# Patient Record
Sex: Female | Born: 1975 | Race: Asian | Hispanic: No | Marital: Married | Smoking: Never smoker
Health system: Southern US, Community
[De-identification: ages and names within clinical notes are randomized; demographics above are authoritative.]

## PROBLEM LIST (undated history)

## (undated) DIAGNOSIS — Z789 Other specified health status: Secondary | ICD-10-CM

## (undated) HISTORY — PX: NO PAST SURGERIES: SHX2092

---

## 2004-05-05 ENCOUNTER — Ambulatory Visit (HOSPITAL_COMMUNITY): Admission: RE | Admit: 2004-05-05 | Discharge: 2004-05-05 | Payer: Self-pay | Admitting: Gynecology

## 2004-09-15 ENCOUNTER — Inpatient Hospital Stay: Payer: Self-pay | Admitting: Obstetrics and Gynecology

## 2005-07-07 IMAGING — US US OB COMP +14 WK
1 series · 13 of 28 positions shown · non-contrast
Comparison: none

CLINICAL DATA: 20 week 0 day gestational age by LMP.  Evaluate dating and anatomy.

[Series 1: us ob comp +14 wk · 0.29mm/px · 13 of 69 slices shown]
[im 3/69]
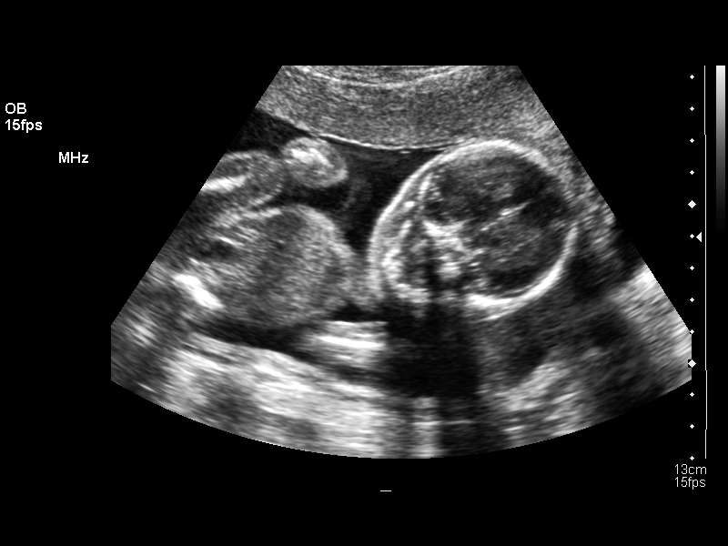
[im 8/69]
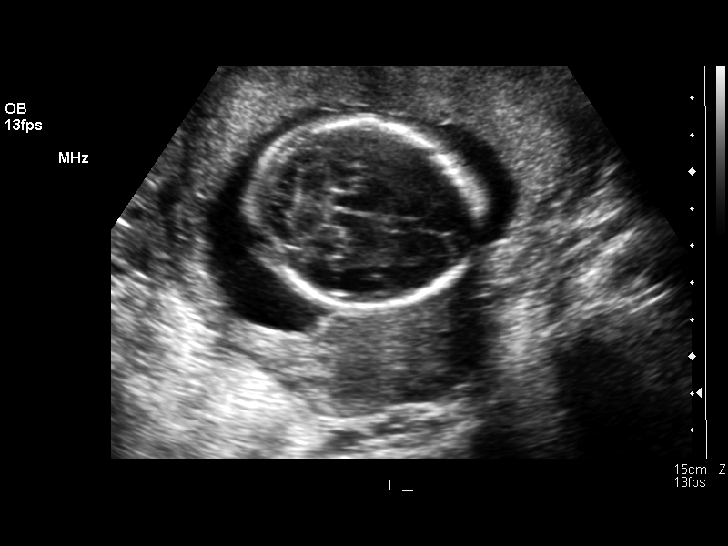
[im 13/69]
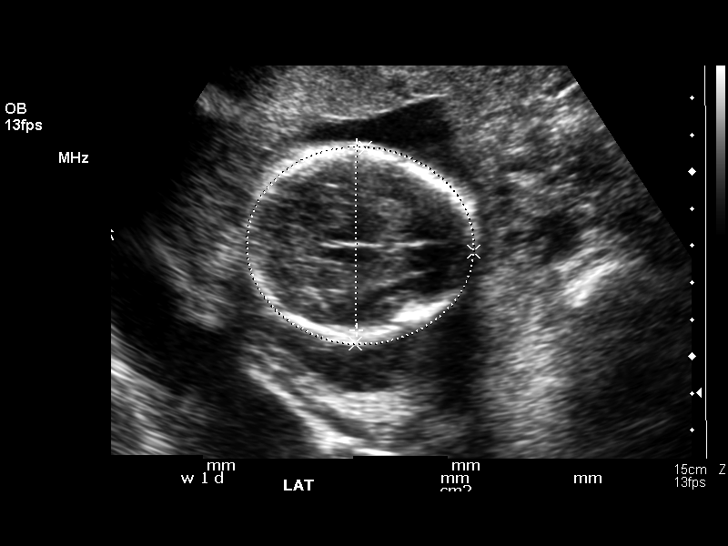
[im 18/69]
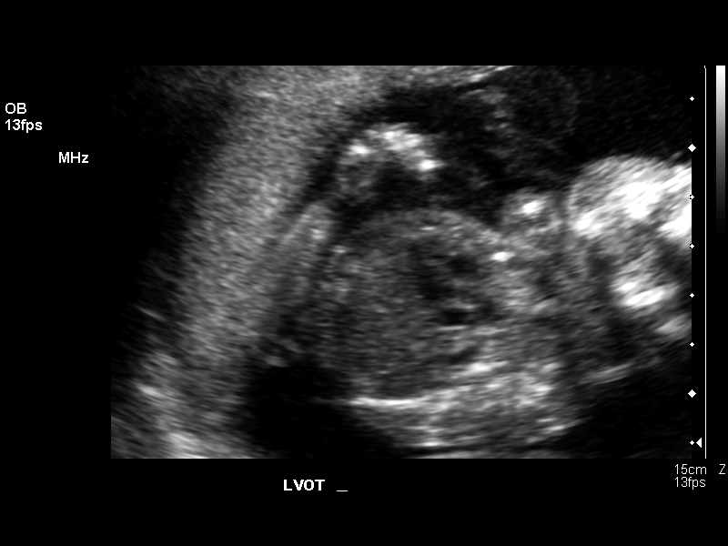
[im 23/69]
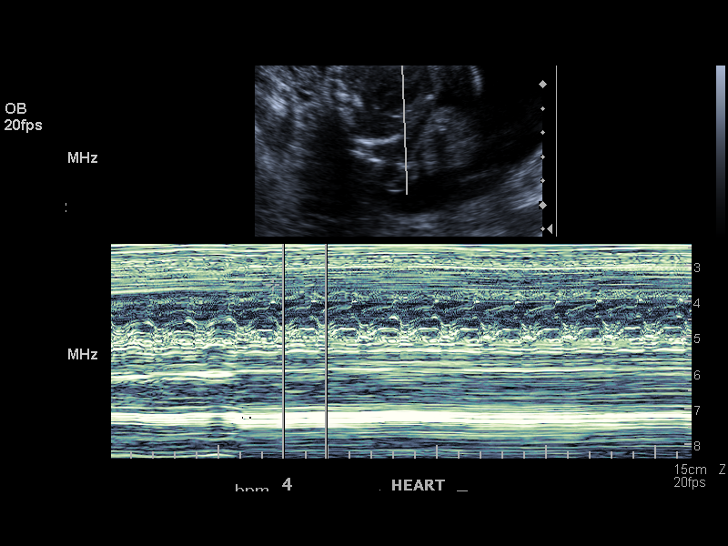
[im 28/69]
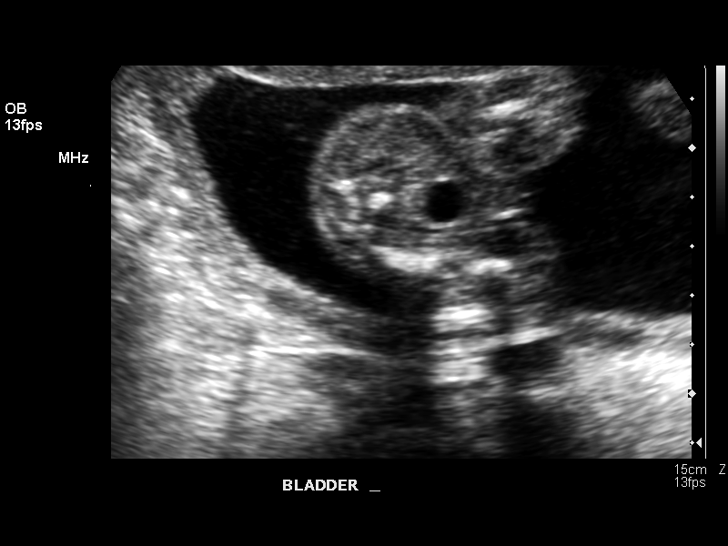
[im 36/69]
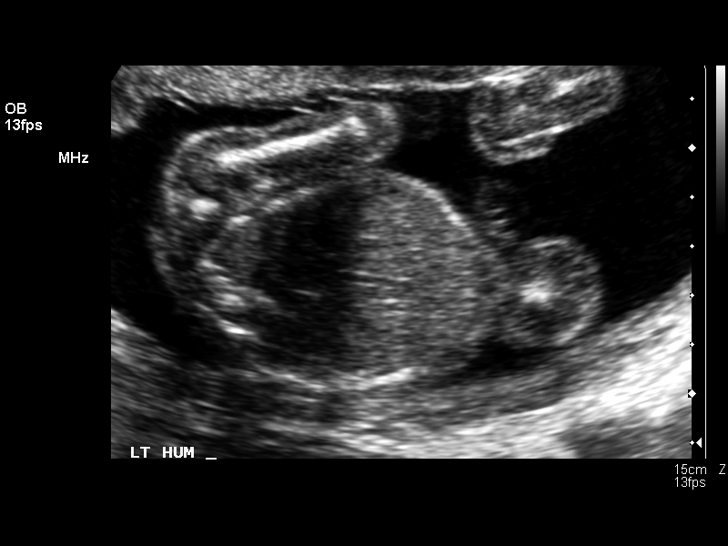
[im 41/69]
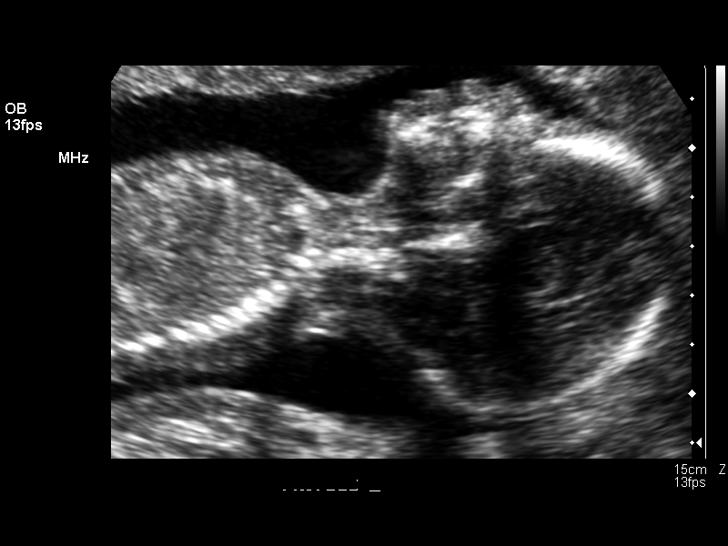
[im 46/69]
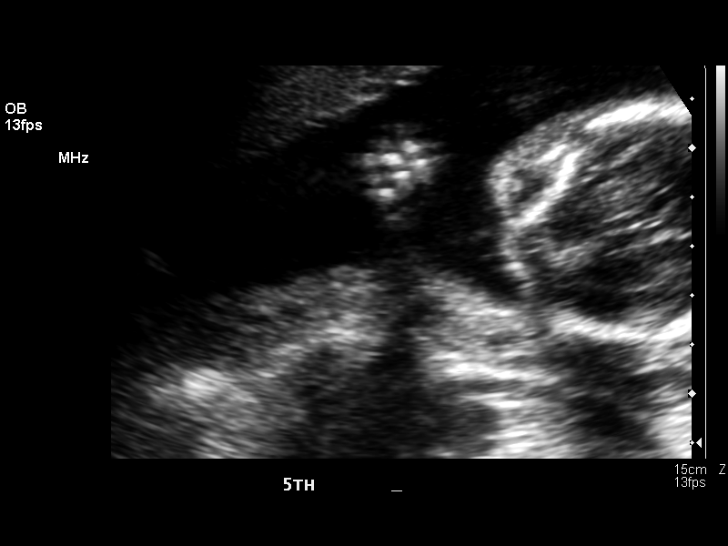
[im 51/69]
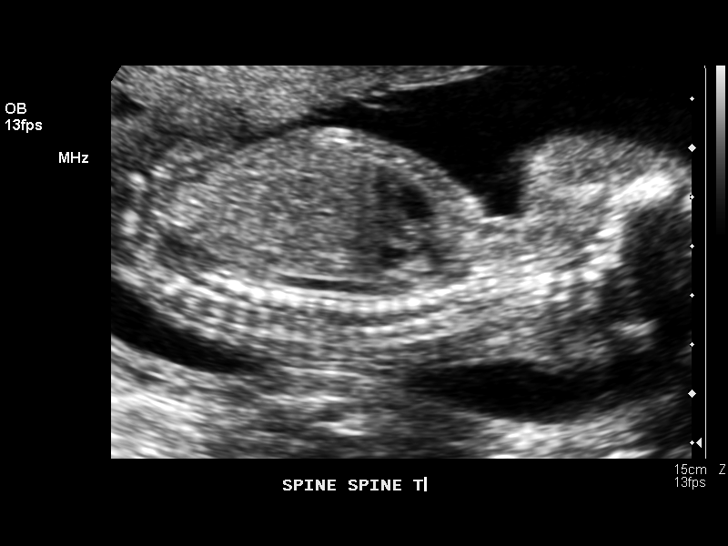
[im 56/69]
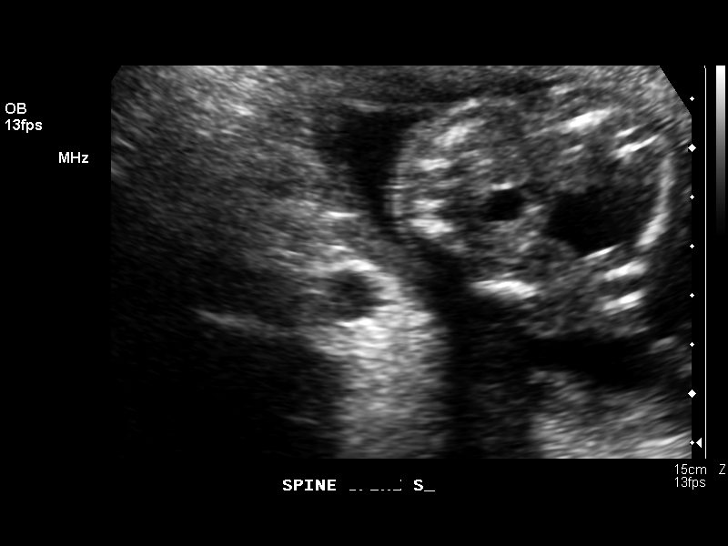
[im 61/69]
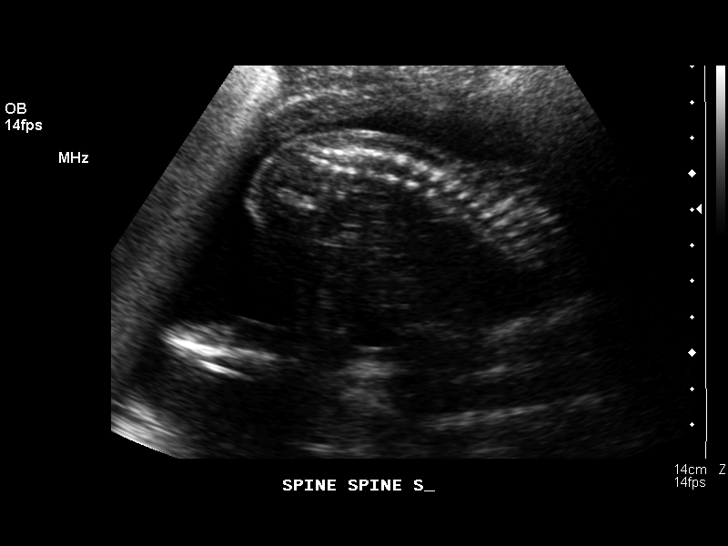
[im 66/69]
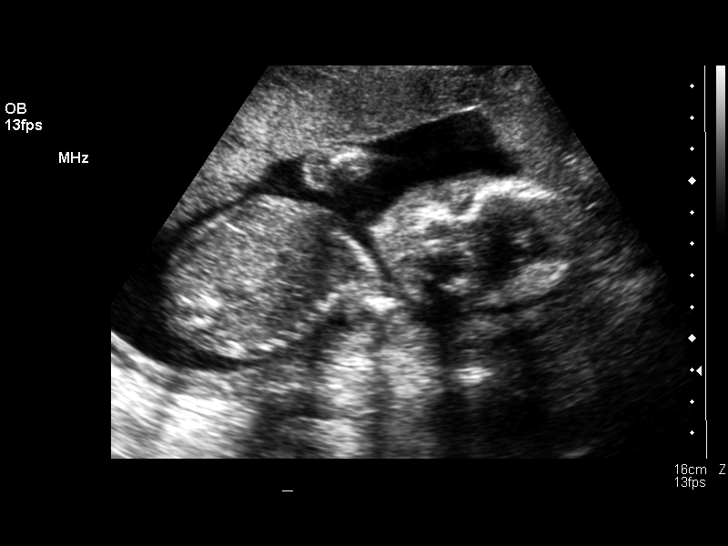

[13 of 28 positions shown; findings below may reference images not displayed]

OBSTETRICAL ULTRASOUND
 Number of Fetuses:  1
 Heart Rate:  153
 Movement:  Yes
 Breathing:    No  
 Presentation:  Cephalic
 Placental Location:  Anterior
 Grade:  I
 Previa:  No
 Amniotic Fluid (Subjective):  Normal
 Amniotic Fluid (Objective):   4.7 cm Vertical pocket 

 FETAL BIOMETRY
 BPD:   5.0 cm   21 w 1 d
 HC:   18.1 cm  20 w 4 d
 AC:   15.6 cm   20 w 5 d
 FL:    3.2 cm   19 w 6 d

 MEAN GA:  20 w 4 d

 FETAL ANATOMY
 Lateral Ventricles:    Visualized 
 Thalami/CSP:      Visualized 
 Posterior Fossa:  Visualized 
 Nuchal Region:    Visualized 
 Spine:      Visualized 
 4 Chamber Heart on Left:      Visualized 
 Stomach on Left:      Visualized 
 3 Vessel Cord:    Visualized 
 Cord Insertion site:    Visualized 
 Kidneys:  Visualized 
 Bladder:  Visualized 
 Extremities:      Visualized 

 ADDITIONAL ANATOMY VISUALIZED:  LVOT, RVOT, upper lip, orbits, profile, diaphragm, 5th digit, ductal arch, aortic arch, and male genitalia.

 MATERNAL FINDINGS
 Cervix:   3.3 cm Transabdominally
IMPRESSION: Single living intrauterine fetus with mean gestational age of 20 weeks 4 days and sonographic EDC of 09/18/04.  This is within one week of stated LMP.
 No evidence of fetal anatomic abnormality.

 </u12:p>

## 2006-12-08 ENCOUNTER — Inpatient Hospital Stay: Payer: Self-pay | Admitting: Obstetrics and Gynecology

## 2015-08-14 ENCOUNTER — Emergency Department
Admission: EM | Admit: 2015-08-14 | Discharge: 2015-08-14 | Disposition: A | Discharge status: Home / Self Care | Payer: BLUE CROSS/BLUE SHIELD | Attending: Emergency Medicine | Admitting: Emergency Medicine

## 2015-08-14 ENCOUNTER — Encounter: Payer: Self-pay | Admitting: *Deleted

## 2015-08-14 ENCOUNTER — Emergency Department: Payer: BLUE CROSS/BLUE SHIELD

## 2015-08-14 DIAGNOSIS — Y92410 Unspecified street and highway as the place of occurrence of the external cause: Secondary | ICD-10-CM | POA: Diagnosis not present

## 2015-08-14 DIAGNOSIS — S29001A Unspecified injury of muscle and tendon of front wall of thorax, initial encounter: Secondary | ICD-10-CM | POA: Insufficient documentation

## 2015-08-14 DIAGNOSIS — Y9389 Activity, other specified: Secondary | ICD-10-CM | POA: Insufficient documentation

## 2015-08-14 DIAGNOSIS — R030 Elevated blood-pressure reading, without diagnosis of hypertension: Secondary | ICD-10-CM | POA: Diagnosis not present

## 2015-08-14 DIAGNOSIS — Y998 Other external cause status: Secondary | ICD-10-CM | POA: Diagnosis not present

## 2015-08-14 DIAGNOSIS — S299XXA Unspecified injury of thorax, initial encounter: Secondary | ICD-10-CM | POA: Diagnosis present

## 2015-08-14 DIAGNOSIS — R0789 Other chest pain: Secondary | ICD-10-CM

## 2015-08-14 MED ORDER — TRAMADOL HCL 50 MG PO TABS: 50.0000 mg | ORAL_TABLET | Freq: Four times a day (QID) | ORAL | Status: AC | PRN

## 2015-08-14 MED ORDER — IBUPROFEN 800 MG PO TABS
800.0000 mg | ORAL_TABLET | Freq: Once | ORAL | Status: AC
  Administered 2015-08-14: 800 mg via ORAL
  Filled 2015-08-14: qty 1

## 2015-08-14 MED ORDER — TRAMADOL HCL 50 MG PO TABS
50.0000 mg | ORAL_TABLET | Freq: Once | ORAL | Status: AC
  Administered 2015-08-14: 50 mg via ORAL
  Filled 2015-08-14: qty 1

## 2015-08-14 MED ORDER — IBUPROFEN 800 MG PO TABS: 800.0000 mg | ORAL_TABLET | Freq: Three times a day (TID) | ORAL | Status: AC | PRN
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
## 2015-08-14 NOTE — Discharge Instructions (Signed)
Chest Wall Pain °Chest wall pain is pain in or around the bones and muscles of your chest. Sometimes, an injury causes this pain. Sometimes, the cause may not be known. This pain may take several weeks or longer to get better. °HOME CARE °Pay attention to any changes in your symptoms. Take these actions to help with your pain: °· Rest as told by your doctor. °· Avoid activities that cause pain. Try not to use your chest, belly (abdominal), or side muscles to lift heavy things. °· If directed, apply ice to the painful area: °¨ Put ice in a plastic bag. °¨ Place a towel between your skin and the bag. °¨ Leave the ice on for 20 minutes, 2-3 times per day. °· Take over-the-counter and prescription medicines only as told by your doctor. °· Do not use tobacco products, including cigarettes, chewing tobacco, and e-cigarettes. If you need help quitting, ask your doctor. °· Keep all follow-up visits as told by your doctor. This is important. °GET HELP IF: °· You have a fever. °· Your chest pain gets worse. °· You have new symptoms. °GET HELP RIGHT AWAY IF: °· You feel sick to your stomach (nauseous) or you throw up (vomit). °· You feel sweaty or light-headed. °· You have a cough with phlegm (sputum) or you cough up blood. °· You are short of breath. °  °This information is not intended to replace advice given to you by your health care provider. Make sure you discuss any questions you have with your health care provider. °  °Document Released: 12/20/2007 Document Revised: 03/24/2015 Document Reviewed: 09/28/2014 °Elsevier Interactive Patient Education ©2016 [employer] ° °
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
## 2019-12-04 ENCOUNTER — Ambulatory Visit: Payer: Self-pay | Attending: Internal Medicine

## 2019-12-04 DIAGNOSIS — Z23 Encounter for immunization: Secondary | ICD-10-CM

## 2019-12-04 NOTE — Progress Notes (Signed)
   Covid-19 Vaccination Clinic  Name:  Yesenia Liu    MRN: [id number] DOB: 08-Dec-1975  12/04/2019  Yesenia Liu was observed post Covid-19 immunization for 15 minutes without incident. She was provided with Vaccine Information Sheet and instruction to access the V-Safe system.   Yesenia Liu was instructed to call 911 with any severe reactions post vaccine: Marland Kitchen Difficulty breathing  . Swelling of face and throat  . A fast heartbeat  . A bad rash all over body  . Dizziness and weakness

## 2019-12-25 ENCOUNTER — Ambulatory Visit: Payer: Self-pay | Attending: Internal Medicine

## 2019-12-25 DIAGNOSIS — Z23 Encounter for immunization: Secondary | ICD-10-CM

## 2019-12-25 NOTE — Progress Notes (Signed)
   Covid-19 Vaccination Clinic  Name:  Yesenia Liu    MRN: [id number] DOB: 1976/05/27  12/25/2019  [name] was observed post Covid-19 immunization for 15 minutes without incident. She was provided with Vaccine Information Sheet and instruction to access the V-Safe system.   [name] was instructed to call 911 with any severe reactions post vaccine: Marland Kitchen Difficulty breathing  . Swelling of face and throat  . A fast heartbeat  . A bad rash all over body  . Dizziness and weakness   Immunizations Administered    Name Date Dose VIS Date Route   Pfizer COVID-19 Vaccine 12/25/2019  4:11 PM 0.3 mL 09/10/2018 Intramuscular   Manufacturer: [employer], [employer]   Lot: [id number]   [place]: [id number]

## 2021-08-08 ENCOUNTER — Encounter: Payer: Self-pay | Admitting: Emergency Medicine

## 2021-08-08 ENCOUNTER — Emergency Department
Admission: EM | Admit: 2021-08-08 | Discharge: 2021-08-08 | Disposition: A | Discharge status: Home / Self Care | Payer: BC Managed Care – PPO | Attending: Emergency Medicine | Admitting: Emergency Medicine

## 2021-08-08 ENCOUNTER — Other Ambulatory Visit: Payer: Self-pay

## 2021-08-08 DIAGNOSIS — R1030 Lower abdominal pain, unspecified: Secondary | ICD-10-CM | POA: Diagnosis present

## 2021-08-08 DIAGNOSIS — K921 Melena: Secondary | ICD-10-CM | POA: Diagnosis not present

## 2021-08-08 LAB — URINALYSIS, ROUTINE W REFLEX MICROSCOPIC
Bacteria, UA: NONE SEEN
Bilirubin Urine: NEGATIVE
Glucose, UA: NEGATIVE mg/dL
Ketones, ur: 40 mg/dL — AB
Leukocytes,Ua: NEGATIVE
Nitrite: NEGATIVE
Protein, ur: NEGATIVE mg/dL
Specific Gravity, Urine: 1.015 (ref 1.005–1.030)
pH: 7 (ref 5.0–8.0)

## 2021-08-08 LAB — COMPREHENSIVE METABOLIC PANEL
ALT: 10 U/L (ref 0–44)
AST: 21 U/L (ref 15–41)
Albumin: 4.2 g/dL (ref 3.5–5.0)
Alkaline Phosphatase: 42 U/L (ref 38–126)
Anion gap: 7 (ref 5–15)
BUN: 10 mg/dL (ref 6–20)
CO2: 24 mmol/L (ref 22–32)
Calcium: 9.1 mg/dL (ref 8.9–10.3)
Chloride: 105 mmol/L (ref 98–111)
Creatinine, Ser: 0.64 mg/dL (ref 0.44–1.00)
GFR, Estimated: >60 mL/min (ref >60)
Glucose, Bld: 98 mg/dL (ref 70–99)
Potassium: 3.6 mmol/L (ref 3.5–5.1)
Sodium: 136 mmol/L (ref 135–145)
Total Bilirubin: 0.5 mg/dL (ref 0.3–1.2)
Total Protein: 7.9 g/dL (ref 6.5–8.1)

## 2021-08-08 LAB — CBC
HCT: 40 % (ref 36.0–46.0)
Hemoglobin: 12.7 g/dL (ref 12.0–15.0)
MCH: 26.8 pg (ref 26.0–34.0)
MCHC: 31.8 g/dL (ref 30.0–36.0)
MCV: 84.4 fL (ref 80.0–100.0)
Platelets: 337 10*3/uL (ref 150–400)
RBC: 4.74 MIL/uL (ref 3.87–5.11)
RDW: 12.9 % (ref 11.5–15.5)
WBC: 7.8 10*3/uL (ref 4.0–10.5)
nRBC: 0 % (ref 0.0–0.2)

## 2021-08-08 LAB — LIPASE, BLOOD: Lipase: 34 U/L (ref 11–51)

## 2021-08-08 LAB — POC URINE PREG, ED: Preg Test, Ur: NEGATIVE

## 2021-08-08 MED ORDER — ACETAMINOPHEN 500 MG PO TABS
1000.0000 mg | ORAL_TABLET | Freq: Once | ORAL | Status: AC
  Administered 2021-08-08: 1000 mg via ORAL
  Filled 2021-08-08: qty 2

## 2021-08-08 NOTE — ED Provider Triage Note (Signed)
Emergency Medicine Provider Triage Evaluation Note  Yesenia Liu, a 46 y.o. female  was evaluated in triage.  Pt complains of generalized abdominal pain and BRBPR. She notes symptoms for the last week.  Review of Systems  Positive: Rectal bleeding Negative: FCS  Physical Exam  Ht 5\' 6"  (1.676 m)    Wt 52.2 kg    BMI 18.57 kg/m  Gen:   Awake, no distress   Resp:  Normal effort  MSK:   Moves extremities without difficulty  Other:  CVS: RRR  Medical Decision Making  Medically screening exam initiated at 4:37 PM.  Appropriate orders placed.  Yesenia Liu was informed that the remainder of the evaluation will be completed by another provider, this initial triage assessment does not replace that evaluation, and the importance of remaining in the ED until their evaluation is complete.  Patient ED evaluation of rectal bleeding for the last week.  Patient presents for evaluation of her complaints.   Yesenia Needles, PA-C 08/08/21 1651

## 2021-08-08 NOTE — ED Provider Notes (Signed)
[facility] Provider Note    Event Date/Time   First MD Initiated Contact with Patient 08/08/21 1820     (approximate)   History   Abdominal Pain   HPI  Yesenia Liu is a 46 y.o. female who presents to the ED for evaluation of Abdominal Pain   No significant medical history in the chart.  Patient presents to the ED for evaluation of 1 week of constant lower abdominal discomfort and some associated hematochezia.  She reports constant cramping lower abdominal pain that is mild to moderate intensity, and has not remitted for more than 1 week.  She has not tried any medications at home.  She further reports small amount of bright red blood on the outside of her formed stool in the same timeframe.  Denies any emesis, hematuria or other bleeding diatheses.  Denies passage of clots or frank blood, just blood on the outside.  Denies any anorectal pain and does not have a history of hemorrhoids.  She and her husband report concern that patient's elderly father recently passed away and he had blood in his stool.   Physical Exam   Triage Vital Signs: ED Triage Vitals  Enc Vitals Group     BP 08/08/21 1644 (!) 163/99     Pulse Rate 08/08/21 1644 89     Resp 08/08/21 1644 18     Temp 08/08/21 1644 98.3 F (36.8 C)     Temp Source 08/08/21 1644 Oral     SpO2 08/08/21 1644 97 %     Weight 08/08/21 1622 115 lb 1.3 oz (52.2 kg)     Height 08/08/21 1622 5\' 6"  (1.676 m)     Head Circumference --      Peak Flow --      Pain Score 08/08/21 1622 5     Pain Loc --      Pain Edu? --      Excl. in [place]? --     Most recent vital signs: Vitals:   08/08/21 1644 08/08/21 1911  BP: (!) 163/99 (!) 148/86  Pulse: 89 76  Resp: 18 16  Temp: 98.3 F (36.8 C)   SpO2: 97% 100%    General: Awake, no distress.  Ambulatory with normal gait.  Well-appearing. CV:  Good peripheral perfusion.  Resp:  Normal effort.  Abd:  No distention.  Soft and benign throughout  without tenderness. MSK:  No deformity noted.  Neuro:  No focal deficits appreciated. Other:     ED Results / Procedures / Treatments   Labs (all labs ordered are listed, but only abnormal results are displayed) Labs Reviewed  URINALYSIS, ROUTINE W REFLEX MICROSCOPIC - Abnormal; Notable for the following components:      Result Value   APPearance CLEAR (*)    Hgb urine dipstick SMALL (*)    Ketones, ur 40 (*)    All other components within normal limits  LIPASE, BLOOD  COMPREHENSIVE METABOLIC PANEL  CBC  POC URINE PREG, ED  TYPE AND SCREEN  TYPE AND SCREEN    EKG    RADIOLOGY   Official radiology report(s): No results found.  PROCEDURES and INTERVENTIONS:  Procedures  Medications  acetaminophen (TYLENOL) tablet 1,000 mg (1,000 mg Oral Given 08/08/21 1838)     IMPRESSION / MDM / ASSESSMENT AND PLAN / ED COURSE  I reviewed the triage vital signs and the nursing notes.  Healthy 46 year old woman presents to the ED with a week of abdominal discomfort  and rectal bleeding suitable for outpatient management and following up with GI.  She looks clinically well and has normal vital signs.  Exam is reassuring and benign.  No abdominal tenderness or pain throughout.  Certainly no peritoneal features or guarding.  Blood work is similarly reassuring with normal CBC, metabolic panel and lipase.  She has no urinary symptoms, but we will obtain a UA due to her lower abdominal discomfort to ensure no evidence of atypical acute cystitis.  Anticipate she would benefit from following up with GI and have a colonoscopy performed as an outpatient. Urine without infectious features and she is not pregnant.  No barriers to outpatient management.     FINAL CLINICAL IMPRESSION(S) / ED DIAGNOSES   Final diagnoses:  Lower abdominal pain  Hematochezia     Rx / DC Orders   ED Discharge Orders     None        Note:  This document was prepared using Dragon voice recognition  software and may include unintentional dictation errors.   Vladimir Crofts, MD 08/08/21 2023

## 2021-08-08 NOTE — Discharge Instructions (Addendum)
As we discussed, please call the gastrointestinal doctor to be seen in the clinic.  You would likely benefit from a colonoscopy.  If you develop severely worsening bleeding or abdominal pain, passing out or fevers with your symptoms, please return to the ED.

## 2021-08-08 NOTE — ED Notes (Signed)
Poct pregnancy Negative 

## 2021-08-08 NOTE — ED Triage Notes (Signed)
C/O abdominal pain and rectal bleeding x 1 week.

## 2021-12-09 ENCOUNTER — Encounter: Payer: Self-pay | Admitting: *Deleted

## 2021-12-13 ENCOUNTER — Encounter
Admission: RE | Disposition: A | Discharge status: Home / Self Care | Payer: Self-pay | Source: Home / Self Care | Attending: Gastroenterology

## 2021-12-13 ENCOUNTER — Ambulatory Visit: Payer: BC Managed Care – PPO | Admitting: Anesthesiology

## 2021-12-13 ENCOUNTER — Ambulatory Visit
Admission: RE | Admit: 2021-12-13 | Discharge: 2021-12-13 | Disposition: A | Discharge status: Home / Self Care | Payer: BC Managed Care – PPO | Attending: Gastroenterology | Admitting: Gastroenterology

## 2021-12-13 DIAGNOSIS — D123 Benign neoplasm of transverse colon: Secondary | ICD-10-CM | POA: Insufficient documentation

## 2021-12-13 DIAGNOSIS — R1084 Generalized abdominal pain: Secondary | ICD-10-CM | POA: Diagnosis present

## 2021-12-13 DIAGNOSIS — Z8371 Family history of colonic polyps: Secondary | ICD-10-CM | POA: Insufficient documentation

## 2021-12-13 HISTORY — PX: COLONOSCOPY WITH PROPOFOL: SHX5780

## 2021-12-13 HISTORY — DX: Other specified health status: Z78.9

## 2021-12-13 LAB — POCT PREGNANCY, URINE: Preg Test, Ur: NEGATIVE

## 2021-12-13 SURGERY — COLONOSCOPY WITH PROPOFOL
Anesthesia: General

## 2021-12-13 MED ORDER — PROPOFOL 10 MG/ML IV BOLUS
INTRAVENOUS | Status: DC | PRN
  Administered 2021-12-13: 20 mg via INTRAVENOUS
  Administered 2021-12-13: 60 mg via INTRAVENOUS
  Administered 2021-12-13: 40 mg via INTRAVENOUS

## 2021-12-13 MED ORDER — EPHEDRINE SULFATE (PRESSORS) 50 MG/ML IJ SOLN
INTRAMUSCULAR | Status: DC | PRN
  Administered 2021-12-13: 5 mg via INTRAVENOUS

## 2021-12-13 MED ORDER — LIDOCAINE HCL (CARDIAC) PF 100 MG/5ML IV SOSY
PREFILLED_SYRINGE | INTRAVENOUS | Status: DC | PRN
  Administered 2021-12-13: 100 mg via INTRAVENOUS

## 2021-12-13 MED ORDER — PROPOFOL 500 MG/50ML IV EMUL
INTRAVENOUS | Status: DC | PRN
  Administered 2021-12-13: 175 ug/kg/min via INTRAVENOUS

## 2021-12-13 MED ORDER — SODIUM CHLORIDE 0.9 % IV SOLN
INTRAVENOUS | Status: DC
  Administered 2021-12-13: 1000 mL via INTRAVENOUS

## 2021-12-13 NOTE — Anesthesia Preprocedure Evaluation (Signed)
Anesthesia Evaluation  Patient identified by MRN, date of birth, ID band Patient awake    Reviewed: Allergy & Precautions, H&P , NPO status , Patient's Chart, lab work & pertinent test results, reviewed documented beta blocker date and time   Airway Mallampati: II   Neck ROM: full    Dental  (+) Poor Dentition   Pulmonary neg pulmonary ROS,    Pulmonary exam normal        Cardiovascular negative cardio ROS Normal cardiovascular exam Rhythm:regular Rate:Normal     Neuro/Psych negative neurological ROS  negative psych ROS   GI/Hepatic negative GI ROS, Neg liver ROS,   Endo/Other  negative endocrine ROS  Renal/GU negative Renal ROS  negative genitourinary   Musculoskeletal   Abdominal   Peds  Hematology negative hematology ROS (+)   Anesthesia Other Findings Past Medical History: No date: Medical history non-contributory Past Surgical History: No date: NO PAST SURGERIES BMI    Body Mass Index: 19.80 kg/m     Reproductive/Obstetrics negative OB ROS                             Anesthesia Physical Anesthesia Plan  ASA: 2  Anesthesia Plan: General   Post-op Pain Management:    Induction:   PONV Risk Score and Plan:   Airway Management Planned:   Additional Equipment:   Intra-op Plan:   Post-operative Plan:   Informed Consent: I have reviewed the patients History and Physical, chart, labs and discussed the procedure including the risks, benefits and alternatives for the proposed anesthesia with the patient or authorized representative who has indicated his/her understanding and acceptance.     Dental Advisory Given  Plan Discussed with: CRNA  Anesthesia Plan Comments:         Anesthesia Quick Evaluation

## 2021-12-13 NOTE — Transfer of Care (Signed)
Immediate Anesthesia Transfer of Care Note  Patient: GEORGIANN NEIDER  Procedure(s) Performed: COLONOSCOPY WITH PROPOFOL  Patient Location: Endoscopy Unit  Anesthesia Type:General  Level of Consciousness: drowsy and patient cooperative  Airway & Oxygen Therapy: Patient Spontanous Breathing and Patient connected to face mask oxygen  Post-op Assessment: Report given to RN and Post -op Vital signs reviewed and stable  Post vital signs: Reviewed and stable  Last Vitals:  Vitals Value Taken Time  BP    Temp    Pulse 101 12/13/21 1416  Resp 19 12/13/21 1416  SpO2 100 % 12/13/21 1416  Vitals shown include unvalidated device data.  Last Pain:  Vitals:   12/13/21 1415  TempSrc: Temporal  PainSc:          Complications: No notable events documented.

## 2021-12-13 NOTE — H&P (Signed)
Outpatient short stay form Pre-procedure 12/13/2021  Lesly Rubenstein, MD  Primary Physician: Pcp, No  Reason for visit:  Abdominal pain  History of present illness:    46 y/o lady with no significant medical history here for colonoscopy due to abdominal pain. No abdominal surgeries. No blood thinners. No family history of GI malignancies.    Current Facility-Administered Medications:    0.9 %  sodium chloride infusion, , Intravenous, Continuous, Sherrod Toothman, Hilton Cork, MD, Last Rate: 20 mL/hr at 12/13/21 1346, 1,000 mL at 12/13/21 1346  Medications Prior to Admission  Medication Sig Dispense Refill Last Dose   ibuprofen (ADVIL,MOTRIN) 800 MG tablet Take 1 tablet (800 mg total) by mouth every 8 (eight) hours as needed. 30 tablet 0 Past Month     No Known Allergies   Past Medical History:  Diagnosis Date   Medical history non-contributory     Review of systems:  Otherwise negative.    Physical Exam  Gen: Alert, oriented. Appears stated age.  HEENT: PERRLA. Lungs: No respiratory distress CV: RRR Abd: soft, benign, no masses Ext: No edema    Planned procedures: Proceed with colonoscopy. The patient understands the nature of the planned procedure, indications, risks, alternatives and potential complications including but not limited to bleeding, infection, perforation, damage to internal organs and possible oversedation/side effects from anesthesia. The patient agrees and gives consent to proceed.  Please refer to procedure notes for findings, recommendations and patient disposition/instructions.     Lesly Rubenstein, MD [facility]

## 2021-12-13 NOTE — Op Note (Signed)
[facility] Patient Name: Yesenia Liu Procedure Date: 12/13/2021 1:50 PM MRN: [id number] Account #: [id number] Date of Birth: [birth year] Admit Type: Outpatient Age: 46 Room: [facility] ENDO ROOM 1 Gender: Female Note Status: Finalized Instrument Name: Jasper Riling [id number] Procedure:             Colonoscopy Indications:           Generalized abdominal pain Providers:             Andrey Farmer MD, MD Referring MD:          No Local Md, MD (Referring MD) Medicines:             Monitored Anesthesia Care Complications:         No immediate complications. Estimated blood loss:                         Minimal. Procedure:             Pre-Anesthesia Assessment:                        - Prior to the procedure, a History and Physical was                         performed, and patient medications and allergies were                         reviewed. The patient is competent. The risks and                         benefits of the procedure and the sedation options and                         risks were discussed with the patient. All questions                         were answered and informed consent was obtained.                         Patient identification and proposed procedure were                         verified by the physician, the nurse, the                         anesthesiologist, the anesthetist and the technician                         in the endoscopy suite. Mental Status Examination:                         alert and oriented. Airway Examination: normal                         oropharyngeal airway and neck mobility. Respiratory                         Examination: clear to auscultation. CV Examination:  normal. Prophylactic Antibiotics: The patient does not                         require prophylactic antibiotics. Prior                         Anticoagulants: The patient has taken no previous                          anticoagulant or antiplatelet agents. ASA Grade                         Assessment: I - A normal, healthy patient. After                         reviewing the risks and benefits, the patient was                         deemed in satisfactory condition to undergo the                         procedure. The anesthesia plan was to use monitored                         anesthesia care (MAC). Immediately prior to                         administration of medications, the patient was                         re-assessed for adequacy to receive sedatives. The                         heart rate, respiratory rate, oxygen saturations,                         blood pressure, adequacy of pulmonary ventilation, and                         response to care were monitored throughout the                         procedure. The physical status of the patient was                         re-assessed after the procedure.                        After obtaining informed consent, the colonoscope was                         passed under direct vision. Throughout the procedure,                         the patient's blood pressure, pulse, and oxygen                         saturations were monitored continuously. The  Colonoscope was introduced through the anus and                         advanced to the the terminal ileum. The colonoscopy                         was performed without difficulty. The patient                         tolerated the procedure well. The quality of the bowel                         preparation was good. Findings:      The perianal and digital rectal examinations were normal.      The terminal ileum appeared normal.      A 4 mm polyp was found in the transverse colon. The polyp was sessile.       The polyp was removed with a cold snare. Resection and retrieval were       complete. Estimated blood loss was minimal.      The exam was otherwise without abnormality on  direct and retroflexion       views. Impression:            - The examined portion of the ileum was normal.                        - One 4 mm polyp in the transverse colon, removed with                         a cold snare. Resected and retrieved.                        - The examination was otherwise normal on direct and                         retroflexion views. Recommendation:        - Discharge patient to home.                        - Resume previous diet.                        - Continue present medications.                        - Await pathology results.                        - Repeat colonoscopy date to be determined after                         pending pathology results are reviewed for                         surveillance.                        - Return to referring physician as previously  scheduled. Procedure Code(s):     --- Professional ---                        [phone], Colonoscopy, flexible; with removal of                         tumor(s), polyp(s), or other lesion(s) by snare                         technique Diagnosis Code(s):     --- Professional ---                        K63.5, Polyp of colon                        R10.84, Generalized abdominal pain CPT copyright 2019 [employer]. All rights reserved. The codes documented in this report are preliminary and upon coder review may  be revised to meet current compliance requirements. Andrey Farmer MD, MD 12/13/2021 2:15:06 PM Number of Addenda: 0 Note Initiated On: 12/13/2021 1:50 PM Scope Withdrawal Time: 0 hours 7 minutes 50 seconds  Total Procedure Duration: 0 hours 13 minutes 28 seconds  Estimated Blood Loss:  Estimated blood loss was minimal.      [facility]

## 2021-12-13 NOTE — Interval H&P Note (Signed)
History and Physical Interval Note:  12/13/2021 1:52 PM  Yesenia Liu  has presented today for surgery, with the diagnosis of family hx of colon polyp.  The various methods of treatment have been discussed with the patient and family. After consideration of risks, benefits and other options for treatment, the patient has consented to  Procedure(s): COLONOSCOPY WITH PROPOFOL (N/A) as a surgical intervention.  The patient's history has been reviewed, patient examined, no change in status, stable for surgery.  I have reviewed the patient's chart and labs.  Questions were answered to the patient's satisfaction.     Lesly Rubenstein  Ok to proceed with colonoscopy

## 2021-12-14 ENCOUNTER — Encounter: Payer: Self-pay | Admitting: Gastroenterology

## 2021-12-14 NOTE — Anesthesia Postprocedure Evaluation (Signed)
Anesthesia Post Note  Patient: Yesenia Liu  Procedure(s) Performed: COLONOSCOPY WITH PROPOFOL  Patient location during evaluation: PACU Anesthesia Type: General Level of consciousness: awake and alert Pain management: pain level controlled Vital Signs Assessment: post-procedure vital signs reviewed and stable Respiratory status: spontaneous breathing, nonlabored ventilation, respiratory function stable and patient connected to nasal cannula oxygen Cardiovascular status: blood pressure returned to baseline and stable Postop Assessment: no apparent nausea or vomiting Anesthetic complications: no   No notable events documented.   Last Vitals:  Vitals:   12/13/21 1415 12/13/21 1435  BP: (!) 87/57   Pulse:    Resp:    Temp: (!) 35.7 C   SpO2: 99% 100%    Last Pain:  Vitals:   12/14/21 0725  TempSrc:   PainSc: 0-No pain                 Molli Barrows

## 2021-12-15 LAB — SURGICAL PATHOLOGY
